# Patient Record
Sex: Male | Born: 1960 | Hispanic: No
Health system: Southern US, Community
[De-identification: ages and names within clinical notes are randomized; demographics above are authoritative.]

---

## 2008-08-18 ENCOUNTER — Emergency Department: Payer: Self-pay | Admitting: Internal Medicine

## 2008-08-30 ENCOUNTER — Emergency Department: Payer: Self-pay | Admitting: Emergency Medicine

## 2010-09-08 ENCOUNTER — Ambulatory Visit: Payer: Self-pay | Admitting: Oncology

## 2010-09-18 ENCOUNTER — Ambulatory Visit: Payer: Self-pay | Admitting: Oncology

## 2010-09-19 LAB — CEA: CEA: 39.2 ng/mL — ABNORMAL HIGH (ref 0.0–4.7)

## 2010-09-20 ENCOUNTER — Telehealth: Payer: Self-pay | Admitting: *Deleted

## 2010-09-20 DIAGNOSIS — K6289 Other specified diseases of anus and rectum: Secondary | ICD-10-CM

## 2010-09-20 NOTE — Telephone Encounter (Signed)
lmom for sister Jonette Eva to call bac. Dr Christella Hartigan would add pt on tomorrow for EUS at [facility] if reached.

## 2010-09-20 NOTE — Telephone Encounter (Signed)
lmom for sister to call back. If available for procedure, I asked her to call 1st thing in am and have pt not eat or drink anything.

## 2010-09-21 ENCOUNTER — Ambulatory Visit (HOSPITAL_COMMUNITY)
Admission: RE | Admit: 2010-09-21 | Discharge: 2010-09-21 | Disposition: A | Discharge status: Home / Self Care | Payer: BC Managed Care – PPO | Source: Ambulatory Visit | Attending: Gastroenterology | Admitting: Gastroenterology

## 2010-09-21 ENCOUNTER — Encounter: Payer: Self-pay | Admitting: *Deleted

## 2010-09-21 ENCOUNTER — Encounter: Payer: BC Managed Care – PPO | Admitting: Gastroenterology

## 2010-09-21 DIAGNOSIS — C20 Malignant neoplasm of rectum: Secondary | ICD-10-CM | POA: Insufficient documentation

## 2010-09-21 NOTE — Telephone Encounter (Signed)
Ok, thanks.

## 2010-09-21 NOTE — Telephone Encounter (Signed)
Dr Christella Hartigan. I spoke with Shawn who showed the CT from 09/19/10 that you saw. Dr Doylene Canning has decided to present the pt's case to CASE CONFERENCE today. Ines Bloomer will let me know of their decision. So no EUS today! Thanks.

## 2010-09-21 NOTE — Telephone Encounter (Signed)
Worked it out and pt will have procedure today.

## 2010-09-29 ENCOUNTER — Ambulatory Visit: Payer: Self-pay | Admitting: Surgery

## 2010-10-08 ENCOUNTER — Ambulatory Visit: Payer: Self-pay | Admitting: Oncology

## 2010-11-08 ENCOUNTER — Ambulatory Visit: Payer: Self-pay | Admitting: Oncology

## 2010-12-09 ENCOUNTER — Ambulatory Visit: Payer: Self-pay | Admitting: Oncology

## 2011-01-03 ENCOUNTER — Ambulatory Visit: Payer: Self-pay | Admitting: Oncology

## 2011-01-08 ENCOUNTER — Ambulatory Visit: Payer: Self-pay | Admitting: Oncology

## 2011-02-08 ENCOUNTER — Ambulatory Visit: Payer: Self-pay | Admitting: Oncology

## 2011-03-10 ENCOUNTER — Ambulatory Visit: Payer: Self-pay | Admitting: Oncology

## 2011-04-10 ENCOUNTER — Ambulatory Visit: Payer: Self-pay | Admitting: Oncology

## 2011-05-03 LAB — COMPREHENSIVE METABOLIC PANEL
Alkaline Phosphatase: 103 U/L (ref 50–136)
Anion Gap: 7 (ref 7–16)
Bilirubin,Total: 0.3 mg/dL (ref 0.2–1.0)
Chloride: 104 mmol/L (ref 98–107)
Co2: 30 mmol/L (ref 21–32)
Creatinine: 0.93 mg/dL (ref 0.60–1.30)
Osmolality: 281 (ref 275–301)
Potassium: 3.5 mmol/L (ref 3.5–5.1)
SGPT (ALT): 15 U/L
Sodium: 141 mmol/L (ref 136–145)

## 2011-05-03 LAB — CBC CANCER CENTER
Basophil #: 0 x10 3/mm (ref 0.0–0.1)
Eosinophil #: 0.4 x10 3/mm (ref 0.0–0.7)
HGB: 13.3 g/dL (ref 13.0–18.0)
Lymphocyte #: 0.9 x10 3/mm — ABNORMAL LOW (ref 1.0–3.6)
MCH: 26.2 pg (ref 26.0–34.0)
MCHC: 32.8 g/dL (ref 32.0–36.0)
MCV: 80 fL (ref 80–100)
Monocyte #: 0.5 x10 3/mm (ref 0.0–0.7)
Monocyte %: 5.9 %
Neutrophil %: 77.2 %
Platelet: 341 x10 3/mm (ref 150–440)
RBC: 5.08 10*6/uL (ref 4.40–5.90)
RDW: 17.7 % — ABNORMAL HIGH (ref 11.5–14.5)
WBC: 7.8 x10 3/mm (ref 3.8–10.6)

## 2011-05-04 LAB — CEA: CEA: 18.5 ng/mL — ABNORMAL HIGH (ref 0.0–4.7)

## 2011-05-11 ENCOUNTER — Ambulatory Visit: Payer: Self-pay | Admitting: Oncology

## 2011-05-17 LAB — CBC CANCER CENTER
Basophil #: 0 x10 3/mm (ref 0.0–0.1)
Eosinophil %: 5.8 %
Lymphocyte #: 1.1 x10 3/mm (ref 1.0–3.6)
MCH: 26.5 pg (ref 26.0–34.0)
MCHC: 33.1 g/dL (ref 32.0–36.0)
MCV: 80 fL (ref 80–100)
Monocyte #: 0.4 x10 3/mm (ref 0.0–0.7)
Platelet: 304 x10 3/mm (ref 150–440)
RBC: 4.94 10*6/uL (ref 4.40–5.90)
RDW: 17 % — ABNORMAL HIGH (ref 11.5–14.5)

## 2011-05-24 LAB — COMPREHENSIVE METABOLIC PANEL WITH GFR
Albumin: 3.4 g/dL
Alkaline Phosphatase: 107 U/L
Anion Gap: 9
BUN: 13 mg/dL
Bilirubin,Total: 0.2 mg/dL
Calcium, Total: 8.8 mg/dL
Chloride: 102 mmol/L
Co2: 31 mmol/L
Creatinine: 0.99 mg/dL
EGFR (African American): >60
EGFR (Non-African Amer.): >60
Glucose: 160 mg/dL — ABNORMAL HIGH
Osmolality: 287
Potassium: 3.2 mmol/L — ABNORMAL LOW
SGOT(AST): 15 U/L
SGPT (ALT): 24 U/L
Sodium: 142 mmol/L
Total Protein: 7.4 g/dL

## 2011-05-24 LAB — CBC CANCER CENTER
Basophil %: 0 %
Eosinophil %: 4.8 %
HCT: 38.9 % — ABNORMAL LOW (ref 40.0–52.0)
HGB: 12.8 g/dL — ABNORMAL LOW (ref 13.0–18.0)
Lymphocyte #: 1.2 x10 3/mm (ref 1.0–3.6)
MCH: 26.5 pg (ref 26.0–34.0)
MCHC: 32.9 g/dL (ref 32.0–36.0)
MCV: 80 fL (ref 80–100)
Neutrophil #: 7.1 x10 3/mm — ABNORMAL HIGH (ref 1.4–6.5)
Platelet: 306 x10 3/mm (ref 150–440)
RBC: 4.84 10*6/uL (ref 4.40–5.90)
RDW: 17.5 % — ABNORMAL HIGH (ref 11.5–14.5)

## 2011-06-07 LAB — CBC CANCER CENTER
Basophil #: 0 x10 3/mm (ref 0.0–0.1)
Eosinophil #: 0.5 x10 3/mm (ref 0.0–0.7)
HCT: 40.6 % (ref 40.0–52.0)
Lymphocyte #: 1.2 x10 3/mm (ref 1.0–3.6)
MCH: 26.2 pg (ref 26.0–34.0)
MCHC: 32.7 g/dL (ref 32.0–36.0)
MCV: 80 fL (ref 80–100)
Monocyte #: 0.7 x10 3/mm (ref 0.0–0.7)
Neutrophil #: 6.6 x10 3/mm — ABNORMAL HIGH (ref 1.4–6.5)
Platelet: 282 x10 3/mm (ref 150–440)
RDW: 17.2 % — ABNORMAL HIGH (ref 11.5–14.5)
WBC: 9.1 x10 3/mm (ref 3.8–10.6)

## 2011-06-07 LAB — MAGNESIUM: Magnesium: 2 mg/dL

## 2011-06-07 LAB — BASIC METABOLIC PANEL
BUN: 10 mg/dL (ref 7–18)
Chloride: 104 mmol/L (ref 98–107)
Co2: 29 mmol/L (ref 21–32)
Creatinine: 0.97 mg/dL (ref 0.60–1.30)
EGFR (African American): >60
EGFR (Non-African Amer.): >60
Potassium: 3.7 mmol/L (ref 3.5–5.1)
Sodium: 143 mmol/L (ref 136–145)

## 2011-06-08 ENCOUNTER — Ambulatory Visit: Payer: Self-pay | Admitting: Oncology

## 2011-07-09 ENCOUNTER — Ambulatory Visit: Payer: Self-pay | Admitting: Oncology

## 2011-09-10 ENCOUNTER — Ambulatory Visit: Payer: Self-pay | Admitting: Oncology

## 2011-09-10 LAB — COMPREHENSIVE METABOLIC PANEL
Alkaline Phosphatase: 85 U/L (ref 50–136)
Anion Gap: 8 (ref 7–16)
Chloride: 103 mmol/L (ref 98–107)
Creatinine: 0.77 mg/dL (ref 0.60–1.30)
EGFR (African American): >60
EGFR (Non-African Amer.): >60
Sodium: 140 mmol/L (ref 136–145)

## 2011-09-10 LAB — CBC CANCER CENTER
Basophil #: 0.1 x10 3/mm (ref 0.0–0.1)
Basophil %: 1.1 %
HCT: 39.8 % — ABNORMAL LOW (ref 40.0–52.0)
HGB: 12.7 g/dL — ABNORMAL LOW (ref 13.0–18.0)
Lymphocyte #: 1 x10 3/mm (ref 1.0–3.6)
Lymphocyte %: 12.2 %
MCV: 82 fL (ref 80–100)
Monocyte #: 0.7 x10 3/mm (ref 0.2–1.0)
Monocyte %: 8.7 %
Neutrophil #: 5.8 x10 3/mm (ref 1.4–6.5)
Neutrophil %: 70.8 %
Platelet: 355 x10 3/mm (ref 150–440)
RBC: 4.83 10*6/uL (ref 4.40–5.90)
WBC: 8.3 x10 3/mm (ref 3.8–10.6)

## 2011-10-08 ENCOUNTER — Ambulatory Visit: Payer: Self-pay | Admitting: Oncology

## 2011-10-18 LAB — CBC CANCER CENTER
Basophil #: 0.1 x10 3/mm (ref 0.0–0.1)
HCT: 38.1 % — ABNORMAL LOW (ref 40.0–52.0)
Lymphocyte #: 1.2 x10 3/mm (ref 1.0–3.6)
MCHC: 32.1 g/dL (ref 32.0–36.0)
Monocyte #: 0.8 x10 3/mm (ref 0.2–1.0)
Monocyte %: 8.1 %
Platelet: 388 x10 3/mm (ref 150–440)
RDW: 14.7 % — ABNORMAL HIGH (ref 11.5–14.5)
WBC: 10.3 x10 3/mm (ref 3.8–10.6)

## 2011-10-18 LAB — COMPREHENSIVE METABOLIC PANEL
Albumin: 3.2 g/dL — ABNORMAL LOW (ref 3.4–5.0)
Anion Gap: 9 (ref 7–16)
Calcium, Total: 9.1 mg/dL (ref 8.5–10.1)
Chloride: 102 mmol/L (ref 98–107)
Co2: 28 mmol/L (ref 21–32)
Glucose: 108 mg/dL — ABNORMAL HIGH (ref 65–99)
Osmolality: 277 (ref 275–301)
Potassium: 3.9 mmol/L (ref 3.5–5.1)
SGPT (ALT): 16 U/L
Sodium: 139 mmol/L (ref 136–145)

## 2011-11-08 ENCOUNTER — Ambulatory Visit: Payer: Self-pay | Admitting: Oncology

## 2011-11-08 LAB — CBC CANCER CENTER
Basophil %: 0.9 %
Eosinophil #: 0.6 x10 3/mm (ref 0.0–0.7)
Eosinophil %: 7.8 %
HCT: 40.2 % (ref 40.0–52.0)
HGB: 13.3 g/dL (ref 13.0–18.0)
Lymphocyte #: 1.2 x10 3/mm (ref 1.0–3.6)
MCH: 26.3 pg (ref 26.0–34.0)
MCHC: 33 g/dL (ref 32.0–36.0)
Monocyte #: 1 x10 3/mm (ref 0.2–1.0)
Monocyte %: 12.2 %
Neutrophil #: 5 x10 3/mm (ref 1.4–6.5)
RDW: 15.1 % — ABNORMAL HIGH (ref 11.5–14.5)
WBC: 7.9 x10 3/mm (ref 3.8–10.6)

## 2011-11-08 LAB — COMPREHENSIVE METABOLIC PANEL
Albumin: 3.3 g/dL — ABNORMAL LOW (ref 3.4–5.0)
Alkaline Phosphatase: 108 U/L (ref 50–136)
Anion Gap: 9 (ref 7–16)
BUN: 9 mg/dL (ref 7–18)
Chloride: 102 mmol/L (ref 98–107)
Creatinine: 0.79 mg/dL (ref 0.60–1.30)
EGFR (Non-African Amer.): >60
Glucose: 94 mg/dL (ref 65–99)
Osmolality: 276 (ref 275–301)
Potassium: 3.6 mmol/L (ref 3.5–5.1)
SGOT(AST): 18 U/L (ref 15–37)
SGPT (ALT): 21 U/L
Total Protein: 8 g/dL (ref 6.4–8.2)

## 2011-12-09 ENCOUNTER — Ambulatory Visit: Payer: Self-pay | Admitting: Oncology

## 2012-01-03 LAB — COMPREHENSIVE METABOLIC PANEL
Alkaline Phosphatase: 95 U/L (ref 50–136)
Anion Gap: 8 (ref 7–16)
BUN: 5 mg/dL — ABNORMAL LOW (ref 7–18)
Bilirubin,Total: 0.3 mg/dL (ref 0.2–1.0)
Calcium, Total: 8.9 mg/dL (ref 8.5–10.1)
Chloride: 105 mmol/L (ref 98–107)
Co2: 29 mmol/L (ref 21–32)
EGFR (African American): >60
EGFR (Non-African Amer.): >60
Osmolality: 280 (ref 275–301)
Total Protein: 7.7 g/dL (ref 6.4–8.2)

## 2012-01-03 LAB — CBC CANCER CENTER
Basophil #: 0.1 x10 3/mm (ref 0.0–0.1)
Lymphocyte #: 1.2 x10 3/mm (ref 1.0–3.6)
MCH: 25.5 pg — ABNORMAL LOW (ref 26.0–34.0)
MCHC: 31.8 g/dL — ABNORMAL LOW (ref 32.0–36.0)
MCV: 80 fL (ref 80–100)
Monocyte #: 0.9 x10 3/mm (ref 0.2–1.0)
Monocyte %: 8.7 %
Neutrophil #: 7.6 x10 3/mm — ABNORMAL HIGH (ref 1.4–6.5)
Neutrophil %: 72.1 %
Platelet: 369 x10 3/mm (ref 150–440)
RDW: 17.3 % — ABNORMAL HIGH (ref 11.5–14.5)
WBC: 10.6 x10 3/mm (ref 3.8–10.6)

## 2012-01-08 ENCOUNTER — Ambulatory Visit: Payer: Self-pay | Admitting: Oncology

## 2012-02-08 ENCOUNTER — Ambulatory Visit: Payer: Self-pay | Admitting: Oncology

## 2012-02-21 LAB — CBC CANCER CENTER
Basophil #: 0.1 x10 3/mm (ref 0.0–0.1)
Eosinophil #: 0.5 x10 3/mm (ref 0.0–0.7)
HGB: 11.7 g/dL — ABNORMAL LOW (ref 13.0–18.0)
Lymphocyte %: 10.3 %
MCH: 24.8 pg — ABNORMAL LOW (ref 26.0–34.0)
MCHC: 31.5 g/dL — ABNORMAL LOW (ref 32.0–36.0)
Neutrophil #: 9.1 x10 3/mm — ABNORMAL HIGH (ref 1.4–6.5)
Neutrophil %: 74.7 %
RDW: 15.7 % — ABNORMAL HIGH (ref 11.5–14.5)
WBC: 12.2 x10 3/mm — ABNORMAL HIGH (ref 3.8–10.6)

## 2012-02-21 LAB — COMPREHENSIVE METABOLIC PANEL
Albumin: 3.3 g/dL — ABNORMAL LOW (ref 3.4–5.0)
Alkaline Phosphatase: 102 U/L (ref 50–136)
Anion Gap: 12 (ref 7–16)
BUN: 16 mg/dL (ref 7–18)
Bilirubin,Total: 0.3 mg/dL (ref 0.2–1.0)
Calcium, Total: 9.2 mg/dL (ref 8.5–10.1)
Chloride: 102 mmol/L (ref 98–107)
Co2: 27 mmol/L (ref 21–32)
EGFR (African American): 46 — ABNORMAL LOW
Glucose: 116 mg/dL — ABNORMAL HIGH (ref 65–99)
SGOT(AST): 18 U/L (ref 15–37)
Total Protein: 8.1 g/dL (ref 6.4–8.2)

## 2012-02-24 LAB — CEA: CEA: 44.9 ng/mL — ABNORMAL HIGH (ref 0.0–4.7)

## 2012-02-28 LAB — COMPREHENSIVE METABOLIC PANEL
Albumin: 3.4 g/dL (ref 3.4–5.0)
Alkaline Phosphatase: 89 U/L (ref 50–136)
BUN: 21 mg/dL — ABNORMAL HIGH (ref 7–18)
Bilirubin,Total: 0.3 mg/dL (ref 0.2–1.0)
Calcium, Total: 9.2 mg/dL (ref 8.5–10.1)
Chloride: 104 mmol/L (ref 98–107)
Co2: 22 mmol/L (ref 21–32)
Creatinine: 1.97 mg/dL — ABNORMAL HIGH (ref 0.60–1.30)
SGPT (ALT): 16 U/L (ref 12–78)
Total Protein: 8.2 g/dL (ref 6.4–8.2)

## 2012-02-28 LAB — CBC CANCER CENTER
Basophil %: 1 %
Eosinophil %: 4.2 %
HGB: 11.2 g/dL — ABNORMAL LOW (ref 13.0–18.0)
Lymphocyte %: 10.3 %
MCV: 79 fL — ABNORMAL LOW (ref 80–100)
Monocyte %: 7.9 %
Neutrophil %: 76.6 %
Platelet: 447 x10 3/mm — ABNORMAL HIGH (ref 150–440)

## 2012-03-05 ENCOUNTER — Ambulatory Visit: Payer: Self-pay | Admitting: General Surgery

## 2012-03-09 ENCOUNTER — Ambulatory Visit: Payer: Self-pay | Admitting: Oncology

## 2012-03-19 LAB — PROTIME-INR
INR: 1
Prothrombin Time: 13.8 secs (ref 11.5–14.7)

## 2012-03-19 LAB — COMPREHENSIVE METABOLIC PANEL
Alkaline Phosphatase: 85 U/L (ref 50–136)
Anion Gap: 15 (ref 7–16)
Calcium, Total: 9.6 mg/dL (ref 8.5–10.1)
Co2: 22 mmol/L (ref 21–32)
EGFR (African American): 40 — ABNORMAL LOW
EGFR (Non-African Amer.): 35 — ABNORMAL LOW
Glucose: 121 mg/dL — ABNORMAL HIGH (ref 65–99)
Osmolality: 282 (ref 275–301)
Potassium: 3.2 mmol/L — ABNORMAL LOW (ref 3.5–5.1)
SGOT(AST): 15 U/L (ref 15–37)
Sodium: 139 mmol/L (ref 136–145)

## 2012-03-19 LAB — CBC CANCER CENTER
Basophil %: 0.7 %
Eosinophil #: 0.3 x10 3/mm (ref 0.0–0.7)
Eosinophil %: 1.8 %
HCT: 32.6 % — ABNORMAL LOW (ref 40.0–52.0)
Lymphocyte %: 10 %
MCH: 24.5 pg — ABNORMAL LOW (ref 26.0–34.0)
MCHC: 32.5 g/dL (ref 32.0–36.0)
Monocyte #: 1.3 x10 3/mm — ABNORMAL HIGH (ref 0.2–1.0)
Monocyte %: 8.5 %
Neutrophil #: 12.6 x10 3/mm — ABNORMAL HIGH (ref 1.4–6.5)
Neutrophil %: 79 %
Platelet: 482 x10 3/mm — ABNORMAL HIGH (ref 150–440)
RDW: 15.7 % — ABNORMAL HIGH (ref 11.5–14.5)
WBC: 15.9 x10 3/mm — ABNORMAL HIGH (ref 3.8–10.6)

## 2012-03-24 ENCOUNTER — Ambulatory Visit: Payer: Self-pay | Admitting: Oncology

## 2012-03-24 LAB — APTT: Activated PTT: 34.9 secs (ref 23.6–35.9)

## 2012-04-01 LAB — COMPREHENSIVE METABOLIC PANEL
Albumin: 3.2 g/dL — ABNORMAL LOW (ref 3.4–5.0)
Anion Gap: 11 (ref 7–16)
BUN: 20 mg/dL — ABNORMAL HIGH (ref 7–18)
Chloride: 97 mmol/L — ABNORMAL LOW (ref 98–107)
Co2: 29 mmol/L (ref 21–32)
EGFR (African American): 53 — ABNORMAL LOW
EGFR (Non-African Amer.): 46 — ABNORMAL LOW
Glucose: 185 mg/dL — ABNORMAL HIGH (ref 65–99)
Osmolality: 281 (ref 275–301)
Potassium: 3.7 mmol/L (ref 3.5–5.1)
SGOT(AST): 16 U/L (ref 15–37)
Sodium: 137 mmol/L (ref 136–145)

## 2012-04-01 LAB — CBC CANCER CENTER
Basophil #: 0.1 x10 3/mm (ref 0.0–0.1)
Basophil %: 0.8 %
Eosinophil #: 0.4 x10 3/mm (ref 0.0–0.7)
Eosinophil %: 2.3 %
HCT: 33 % — ABNORMAL LOW (ref 40.0–52.0)
HGB: 10.5 g/dL — ABNORMAL LOW (ref 13.0–18.0)
Lymphocyte %: 7.1 %
MCH: 23.8 pg — ABNORMAL LOW (ref 26.0–34.0)
MCHC: 31.7 g/dL — ABNORMAL LOW (ref 32.0–36.0)
Monocyte #: 1 x10 3/mm (ref 0.2–1.0)
Neutrophil #: 15.5 x10 3/mm — ABNORMAL HIGH (ref 1.4–6.5)
Platelet: 575 x10 3/mm — ABNORMAL HIGH (ref 150–440)
RDW: 15.7 % — ABNORMAL HIGH (ref 11.5–14.5)
WBC: 18.3 x10 3/mm — ABNORMAL HIGH (ref 3.8–10.6)

## 2012-04-09 ENCOUNTER — Ambulatory Visit: Payer: Self-pay | Admitting: Oncology

## 2012-04-23 ENCOUNTER — Ambulatory Visit: Payer: Self-pay | Admitting: Oncology

## 2012-04-23 LAB — PROTIME-INR
INR: 1
Prothrombin Time: 13.5 secs (ref 11.5–14.7)

## 2012-04-23 LAB — PLATELET COUNT: Platelet: 657 10*3/uL — ABNORMAL HIGH (ref 150–440)

## 2012-04-23 LAB — APTT: Activated PTT: 41 secs — ABNORMAL HIGH (ref 23.6–35.9)

## 2012-04-29 LAB — COMPREHENSIVE METABOLIC PANEL
Albumin: 2.7 g/dL — ABNORMAL LOW (ref 3.4–5.0)
Alkaline Phosphatase: 92 U/L (ref 50–136)
Anion Gap: 9 (ref 7–16)
BUN: 9 mg/dL (ref 7–18)
Bilirubin,Total: 0.3 mg/dL (ref 0.2–1.0)
Co2: 30 mmol/L (ref 21–32)
Creatinine: 1.18 mg/dL (ref 0.60–1.30)
EGFR (African American): >60
EGFR (Non-African Amer.): >60
Potassium: 3.4 mmol/L — ABNORMAL LOW (ref 3.5–5.1)
SGOT(AST): 18 U/L (ref 15–37)
SGPT (ALT): 18 U/L (ref 12–78)
Sodium: 139 mmol/L (ref 136–145)
Total Protein: 7.7 g/dL (ref 6.4–8.2)

## 2012-04-29 LAB — CBC CANCER CENTER
Eosinophil %: 3.5 %
HCT: 27.3 % — ABNORMAL LOW (ref 40.0–52.0)
HGB: 8.6 g/dL — ABNORMAL LOW (ref 13.0–18.0)
MCH: 22.9 pg — ABNORMAL LOW (ref 26.0–34.0)
Monocyte #: 1.5 x10 3/mm — ABNORMAL HIGH (ref 0.2–1.0)
Platelet: 694 x10 3/mm — ABNORMAL HIGH (ref 150–440)
RBC: 3.75 10*6/uL — ABNORMAL LOW (ref 4.40–5.90)
RDW: 15.6 % — ABNORMAL HIGH (ref 11.5–14.5)
WBC: 20.1 x10 3/mm — ABNORMAL HIGH (ref 3.8–10.6)

## 2012-05-10 ENCOUNTER — Ambulatory Visit: Payer: Self-pay | Admitting: Oncology

## 2012-05-13 LAB — CBC CANCER CENTER
Basophil #: 0.2 x10 3/mm — ABNORMAL HIGH (ref 0.0–0.1)
Basophil %: 1.5 %
Eosinophil %: 8.6 %
HCT: 28.4 % — ABNORMAL LOW (ref 40.0–52.0)
HGB: 9 g/dL — ABNORMAL LOW (ref 13.0–18.0)
Lymphocyte #: 1.3 x10 3/mm (ref 1.0–3.6)
Lymphocyte %: 13.1 %
MCH: 22.9 pg — ABNORMAL LOW (ref 26.0–34.0)
MCV: 72 fL — ABNORMAL LOW (ref 80–100)
Monocyte #: 1 x10 3/mm (ref 0.2–1.0)
Neutrophil #: 6.6 x10 3/mm — ABNORMAL HIGH (ref 1.4–6.5)
Neutrophil %: 67.1 %
RBC: 3.94 10*6/uL — ABNORMAL LOW (ref 4.40–5.90)
RDW: 16 % — ABNORMAL HIGH (ref 11.5–14.5)
WBC: 9.8 x10 3/mm (ref 3.8–10.6)

## 2012-05-13 LAB — COMPREHENSIVE METABOLIC PANEL
Albumin: 2.6 g/dL — ABNORMAL LOW (ref 3.4–5.0)
Anion Gap: 12 (ref 7–16)
Bilirubin,Total: 0.2 mg/dL (ref 0.2–1.0)
Calcium, Total: 9 mg/dL (ref 8.5–10.1)
Creatinine: 1.39 mg/dL — ABNORMAL HIGH (ref 0.60–1.30)
EGFR (Non-African Amer.): 59 — ABNORMAL LOW
Glucose: 131 mg/dL — ABNORMAL HIGH (ref 65–99)
Osmolality: 277 (ref 275–301)
Potassium: 3.5 mmol/L (ref 3.5–5.1)
SGOT(AST): 13 U/L — ABNORMAL LOW (ref 15–37)
Sodium: 138 mmol/L (ref 136–145)
Total Protein: 7.8 g/dL (ref 6.4–8.2)

## 2012-05-28 LAB — CBC CANCER CENTER
Basophil %: 1.8 %
Eosinophil %: 5.4 %
Lymphocyte #: 1.3 x10 3/mm (ref 1.0–3.6)
Lymphocyte %: 14 %
MCH: 22.9 pg — ABNORMAL LOW (ref 26.0–34.0)
MCV: 72 fL — ABNORMAL LOW (ref 80–100)
Monocyte #: 1.2 x10 3/mm — ABNORMAL HIGH (ref 0.2–1.0)

## 2012-05-28 LAB — COMPREHENSIVE METABOLIC PANEL
Albumin: 2.7 g/dL — ABNORMAL LOW (ref 3.4–5.0)
Alkaline Phosphatase: 109 U/L (ref 50–136)
BUN: 6 mg/dL — ABNORMAL LOW (ref 7–18)
Calcium, Total: 8.7 mg/dL (ref 8.5–10.1)
Chloride: 102 mmol/L (ref 98–107)
EGFR (African American): >60
Osmolality: 276 (ref 275–301)
Potassium: 3.3 mmol/L — ABNORMAL LOW (ref 3.5–5.1)

## 2012-06-07 ENCOUNTER — Ambulatory Visit: Payer: Self-pay | Admitting: Oncology

## 2012-07-08 ENCOUNTER — Ambulatory Visit: Payer: Self-pay | Admitting: Oncology

## 2012-07-08 LAB — COMPREHENSIVE METABOLIC PANEL
Albumin: 2.4 g/dL — ABNORMAL LOW (ref 3.4–5.0)
Alkaline Phosphatase: 131 U/L (ref 50–136)
Anion Gap: 14 (ref 7–16)
Bilirubin,Total: 0.4 mg/dL (ref 0.2–1.0)
Co2: 24 mmol/L (ref 21–32)
Creatinine: 1.66 mg/dL — ABNORMAL HIGH (ref 0.60–1.30)
EGFR (Non-African Amer.): 47 — ABNORMAL LOW
Osmolality: 273 (ref 275–301)
Potassium: 3.5 mmol/L (ref 3.5–5.1)
Total Protein: 8.2 g/dL (ref 6.4–8.2)

## 2012-07-08 LAB — CBC CANCER CENTER
Basophil #: 0.1 x10 3/mm (ref 0.0–0.1)
Basophil %: 0.3 %
Eosinophil #: 0.1 x10 3/mm (ref 0.0–0.7)
Eosinophil %: 0.2 %
Lymphocyte %: 2.8 %
MCH: 21.8 pg — ABNORMAL LOW (ref 26.0–34.0)
MCV: 70 fL — ABNORMAL LOW (ref 80–100)
Monocyte #: 1.7 x10 3/mm — ABNORMAL HIGH (ref 0.2–1.0)
Monocyte %: 5.7 %
Neutrophil #: 27.7 x10 3/mm — ABNORMAL HIGH (ref 1.4–6.5)
Neutrophil %: 91 %
Platelet: 553 x10 3/mm — ABNORMAL HIGH (ref 150–440)
RBC: 4.36 10*6/uL — ABNORMAL LOW (ref 4.40–5.90)

## 2012-07-09 LAB — CEA: CEA: 58.2 ng/mL — ABNORMAL HIGH (ref 0.0–4.7)

## 2012-08-05 LAB — COMPREHENSIVE METABOLIC PANEL
Albumin: 2.2 g/dL — ABNORMAL LOW (ref 3.4–5.0)
Bilirubin,Total: 0.3 mg/dL (ref 0.2–1.0)
Calcium, Total: 9.2 mg/dL (ref 8.5–10.1)
Chloride: 97 mmol/L — ABNORMAL LOW (ref 98–107)
Co2: 14 mmol/L — ABNORMAL LOW (ref 21–32)
Creatinine: 4 mg/dL — ABNORMAL HIGH (ref 0.60–1.30)
EGFR (African American): 19 — ABNORMAL LOW
EGFR (Non-African Amer.): 16 — ABNORMAL LOW
Glucose: 136 mg/dL — ABNORMAL HIGH (ref 65–99)
Potassium: 2.7 mmol/L — ABNORMAL LOW (ref 3.5–5.1)
SGOT(AST): 15 U/L (ref 15–37)
SGPT (ALT): 18 U/L (ref 12–78)
Sodium: 130 mmol/L — ABNORMAL LOW (ref 136–145)
Total Protein: 8.5 g/dL — ABNORMAL HIGH (ref 6.4–8.2)

## 2012-08-05 LAB — CBC CANCER CENTER
Basophil %: 0.6 %
Eosinophil #: 0.2 x10 3/mm (ref 0.0–0.7)
Eosinophil %: 0.7 %
Lymphocyte %: 4.6 %
MCH: 20.8 pg — ABNORMAL LOW (ref 26.0–34.0)
MCHC: 30 g/dL — ABNORMAL LOW (ref 32.0–36.0)
MCV: 69 fL — ABNORMAL LOW (ref 80–100)
Monocyte #: 1.4 x10 3/mm — ABNORMAL HIGH (ref 0.2–1.0)
Monocyte %: 5.8 %
Neutrophil #: 21.6 x10 3/mm — ABNORMAL HIGH (ref 1.4–6.5)

## 2012-08-07 ENCOUNTER — Ambulatory Visit: Payer: Self-pay | Admitting: Oncology

## 2012-08-28 LAB — CBC WITH DIFFERENTIAL/PLATELET
Basophil %: 0.3 %
Eosinophil #: 0 10*3/uL (ref 0.0–0.7)
HCT: 18.2 % — ABNORMAL LOW (ref 40.0–52.0)
HGB: 5.5 g/dL — ABNORMAL LOW (ref 13.0–18.0)
Lymphocyte #: 0.3 10*3/uL — ABNORMAL LOW (ref 1.0–3.6)
Lymphocyte %: 1.1 %
MCHC: 30.1 g/dL — ABNORMAL LOW (ref 32.0–36.0)
MCV: 66 fL — ABNORMAL LOW (ref 80–100)
Monocyte %: 0.6 %
Neutrophil #: 22.9 10*3/uL — ABNORMAL HIGH (ref 1.4–6.5)
Neutrophil %: 97.9 %
RBC: 2.77 10*6/uL — ABNORMAL LOW (ref 4.40–5.90)
RDW: 19.9 % — ABNORMAL HIGH (ref 11.5–14.5)
WBC: 23.4 10*3/uL — ABNORMAL HIGH (ref 3.8–10.6)

## 2012-08-28 LAB — URINALYSIS, COMPLETE
Bilirubin,UR: NEGATIVE
Glucose,UR: NEGATIVE mg/dL (ref 0–75)
Nitrite: NEGATIVE
Ph: 7 (ref 4.5–8.0)
Protein: 30
RBC,UR: 14 /HPF (ref 0–5)
Squamous Epithelial: NONE SEEN

## 2012-08-28 LAB — COMPREHENSIVE METABOLIC PANEL
Alkaline Phosphatase: 120 U/L (ref 50–136)
Anion Gap: 10 (ref 7–16)
Bilirubin,Total: 0.3 mg/dL (ref 0.2–1.0)
Calcium, Total: 8 mg/dL — ABNORMAL LOW (ref 8.5–10.1)
Co2: 17 mmol/L — ABNORMAL LOW (ref 21–32)
EGFR (African American): 24 — ABNORMAL LOW
EGFR (Non-African Amer.): 20 — ABNORMAL LOW
Osmolality: 283 (ref 275–301)
Potassium: 2.5 mmol/L — CL (ref 3.5–5.1)
SGOT(AST): 13 U/L — ABNORMAL LOW (ref 15–37)
SGPT (ALT): 9 U/L — ABNORMAL LOW (ref 12–78)
Sodium: 134 mmol/L — ABNORMAL LOW (ref 136–145)
Total Protein: 7 g/dL (ref 6.4–8.2)

## 2012-08-28 LAB — PROTIME-INR: INR: 1

## 2012-08-28 LAB — APTT: Activated PTT: 45.6 secs — ABNORMAL HIGH (ref 23.6–35.9)

## 2012-08-29 ENCOUNTER — Inpatient Hospital Stay: Payer: Self-pay | Admitting: Oncology

## 2012-08-29 LAB — COMPREHENSIVE METABOLIC PANEL
Albumin: 1.5 g/dL — ABNORMAL LOW (ref 3.4–5.0)
Alkaline Phosphatase: 119 U/L (ref 50–136)
Anion Gap: 12 (ref 7–16)
BUN: 48 mg/dL — ABNORMAL HIGH (ref 7–18)
Bilirubin,Total: 0.5 mg/dL (ref 0.2–1.0)
Creatinine: 3.18 mg/dL — ABNORMAL HIGH (ref 0.60–1.30)
Glucose: 128 mg/dL — ABNORMAL HIGH (ref 65–99)
Osmolality: 294 (ref 275–301)
Potassium: 3.3 mmol/L — ABNORMAL LOW (ref 3.5–5.1)
SGOT(AST): 13 U/L — ABNORMAL LOW (ref 15–37)
SGPT (ALT): 9 U/L — ABNORMAL LOW (ref 12–78)
Sodium: 140 mmol/L (ref 136–145)
Total Protein: 6.3 g/dL — ABNORMAL LOW (ref 6.4–8.2)

## 2012-08-29 LAB — CBC WITH DIFFERENTIAL/PLATELET
Basophil #: 0.1 10*3/uL (ref 0.0–0.1)
Eosinophil %: 0 %
MCH: 22.2 pg — ABNORMAL LOW (ref 26.0–34.0)
MCV: 72 fL — ABNORMAL LOW (ref 80–100)
Monocyte #: 1 x10 3/mm (ref 0.2–1.0)
Monocyte %: 3 %
RDW: 22.1 % — ABNORMAL HIGH (ref 11.5–14.5)

## 2012-08-30 LAB — CBC WITH DIFFERENTIAL/PLATELET
Basophil #: 0.1 10*3/uL (ref 0.0–0.1)
Basophil %: 0.6 %
Eosinophil %: 2.2 %
HCT: 26.7 % — ABNORMAL LOW (ref 40.0–52.0)
HGB: 8.5 g/dL — ABNORMAL LOW (ref 13.0–18.0)
MCV: 71 fL — ABNORMAL LOW (ref 80–100)
Monocyte %: 5.8 %
Neutrophil #: 19.3 10*3/uL — ABNORMAL HIGH (ref 1.4–6.5)
Platelet: 539 10*3/uL — ABNORMAL HIGH (ref 150–440)
WBC: 22.6 10*3/uL — ABNORMAL HIGH (ref 3.8–10.6)

## 2012-08-30 LAB — COMPREHENSIVE METABOLIC PANEL
Albumin: 1.5 g/dL — ABNORMAL LOW (ref 3.4–5.0)
Alkaline Phosphatase: 112 U/L (ref 50–136)
BUN: 45 mg/dL — ABNORMAL HIGH (ref 7–18)
Bilirubin,Total: 0.2 mg/dL (ref 0.2–1.0)
Calcium, Total: 8 mg/dL — ABNORMAL LOW (ref 8.5–10.1)
Chloride: 116 mmol/L — ABNORMAL HIGH (ref 98–107)
Co2: 14 mmol/L — ABNORMAL LOW (ref 21–32)
EGFR (African American): 27 — ABNORMAL LOW
Glucose: 130 mg/dL — ABNORMAL HIGH (ref 65–99)
Osmolality: 295 (ref 275–301)
Potassium: 3.2 mmol/L — ABNORMAL LOW (ref 3.5–5.1)
SGOT(AST): 14 U/L — ABNORMAL LOW (ref 15–37)
SGPT (ALT): 9 U/L — ABNORMAL LOW (ref 12–78)
Sodium: 141 mmol/L (ref 136–145)
Total Protein: 6.4 g/dL (ref 6.4–8.2)

## 2012-08-31 LAB — BASIC METABOLIC PANEL
BUN: 33 mg/dL — ABNORMAL HIGH (ref 7–18)
Calcium, Total: 7.8 mg/dL — ABNORMAL LOW (ref 8.5–10.1)
Chloride: 116 mmol/L — ABNORMAL HIGH (ref 98–107)
Creatinine: 2.52 mg/dL — ABNORMAL HIGH (ref 0.60–1.30)
EGFR (African American): 33 — ABNORMAL LOW
EGFR (Non-African Amer.): 29 — ABNORMAL LOW
Glucose: 92 mg/dL (ref 65–99)

## 2012-09-01 LAB — URINE CULTURE

## 2012-09-01 LAB — CBC WITH DIFFERENTIAL/PLATELET
Basophil #: 0.1 10*3/uL (ref 0.0–0.1)
Basophil %: 0.7 %
HCT: 25.9 % — ABNORMAL LOW (ref 40.0–52.0)
Lymphocyte #: 1 10*3/uL (ref 1.0–3.6)
MCH: 23.9 pg — ABNORMAL LOW (ref 26.0–34.0)
MCHC: 32.8 g/dL (ref 32.0–36.0)
MCV: 73 fL — ABNORMAL LOW (ref 80–100)
Neutrophil #: 9.7 10*3/uL — ABNORMAL HIGH (ref 1.4–6.5)
Neutrophil %: 79.4 %
Platelet: 527 10*3/uL — ABNORMAL HIGH (ref 150–440)
RBC: 3.55 10*6/uL — ABNORMAL LOW (ref 4.40–5.90)
WBC: 12.2 10*3/uL — ABNORMAL HIGH (ref 3.8–10.6)

## 2012-09-02 LAB — CBC WITH DIFFERENTIAL/PLATELET
Basophil #: 0.1 10*3/uL (ref 0.0–0.1)
Basophil %: 1 %
Eosinophil #: 0.4 10*3/uL (ref 0.0–0.7)
HGB: 8.5 g/dL — ABNORMAL LOW (ref 13.0–18.0)
Lymphocyte %: 8.8 %
MCV: 73 fL — ABNORMAL LOW (ref 80–100)
Monocyte #: 0.5 x10 3/mm (ref 0.2–1.0)
Platelet: 520 10*3/uL — ABNORMAL HIGH (ref 150–440)
RBC: 3.45 10*6/uL — ABNORMAL LOW (ref 4.40–5.90)
RDW: 23.1 % — ABNORMAL HIGH (ref 11.5–14.5)
WBC: 6.2 10*3/uL (ref 3.8–10.6)

## 2012-09-02 LAB — COMPREHENSIVE METABOLIC PANEL
Anion Gap: 11 (ref 7–16)
BUN: 20 mg/dL — ABNORMAL HIGH (ref 7–18)
Bilirubin,Total: 0.2 mg/dL (ref 0.2–1.0)
Calcium, Total: 7.9 mg/dL — ABNORMAL LOW (ref 8.5–10.1)
Chloride: 115 mmol/L — ABNORMAL HIGH (ref 98–107)
EGFR (African American): 39 — ABNORMAL LOW
Osmolality: 291 (ref 275–301)
SGOT(AST): 18 U/L (ref 15–37)
SGPT (ALT): 10 U/L — ABNORMAL LOW (ref 12–78)

## 2012-09-02 LAB — MISC AER/ANAEROBIC CULT.

## 2012-09-03 LAB — CULTURE, BLOOD (SINGLE)

## 2012-09-07 ENCOUNTER — Ambulatory Visit: Payer: Self-pay | Admitting: Oncology

## 2012-10-07 ENCOUNTER — Ambulatory Visit: Payer: Self-pay | Admitting: Oncology

## 2012-12-02 ENCOUNTER — Emergency Department: Payer: Self-pay | Admitting: Emergency Medicine

## 2012-12-02 LAB — CBC
HCT: 23.9 % — ABNORMAL LOW (ref 40.0–52.0)
HGB: 7.4 g/dL — ABNORMAL LOW (ref 13.0–18.0)
MCH: 22 pg — ABNORMAL LOW (ref 26.0–34.0)
Platelet: 16 10*3/uL — CL (ref 150–440)
RDW: 21.3 % — ABNORMAL HIGH (ref 11.5–14.5)
WBC: 61.3 10*3/uL — ABNORMAL HIGH (ref 3.8–10.6)

## 2012-12-02 LAB — COMPREHENSIVE METABOLIC PANEL
Alkaline Phosphatase: 995 U/L — ABNORMAL HIGH (ref 50–136)
Anion Gap: 20 — ABNORMAL HIGH (ref 7–16)
BUN: 162 mg/dL — ABNORMAL HIGH (ref 7–18)
Calcium, Total: 8 mg/dL — ABNORMAL LOW (ref 8.5–10.1)
Co2: 11 mmol/L — ABNORMAL LOW (ref 21–32)
EGFR (African American): 9 — ABNORMAL LOW
EGFR (Non-African Amer.): 8 — ABNORMAL LOW
SGOT(AST): 38 U/L — ABNORMAL HIGH (ref 15–37)
Sodium: 126 mmol/L — ABNORMAL LOW (ref 136–145)
Total Protein: 5.8 g/dL — ABNORMAL LOW (ref 6.4–8.2)

## 2012-12-08 DEATH — deceased

## 2014-06-08 IMAGING — CT NM PET TUM IMG RESTAG (PS) SKULL BASE T - THIGH
1 of 5 series · 1 of 25 positions shown · non-contrast
Comparison: none

REASON FOR EXAM: elevated creatinine   Restaging rectal CA
COMMENTS:

PROCEDURE:     PET - PET/CT COLORECTAL CA RESTG  - March 05, 2012 [DATE]
RESULT:

[Series 102: pet wb · axial · 5.0mm · 4.07mm/px · 1 of 290 slices shown]
[im 145/290]
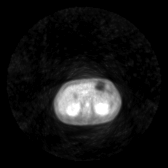

[1 of 25 positions shown; findings below may reference images not displayed]

FINDINGS: The patient has a fasting blood glucose level measuring 118 mg/dl.
The patient received an injection of 12.62 mCi of F18-FDG in the left
antecubital region at [DATE] with imaging obtained from the base of the
brain into the thighs between the hours of [DATE] and [DATE]. Low dose
noncontrast CT of the same regions is performed for the purposes of
attenuation correction and fusion.

There is a similar previous exam for comparison from 03 January, 2011.

There is progressive left hydronephrosis and hydroureter. The urinary
bladder wall is thickened diffusely. Right hydronephrosis is demonstrated.
Abnormal localization is present within the right maxilla region in the area
of the alveolar ridge showing an SUV of 5.94 with a mean of 3.55. There is a
right upper lobe area of abnormal localization showing a maximum SUV of
with a mean of 5.5. There is a mass at the level of the aortic arch
anteriorly in the right upper lobe showing a maximum SUV of 9.89 with a mean
of 6.6. There is a small area anteriorly in the left lung at the AP window
showing a maximum SUV of 1.9 with a mean of 1.1 with infrahilar right lower
lobe masses showing abnormal localization with the more anterior showing a
maximum SUV of 7.84 and a mean of 4.98 and the more posterior showing a
maximum SUV of 6.91 with a mean of 4.86. The left kidney does excrete
activity which collects in the urinary bladder. The obstruction on the right
kidney appears to be fairly high-grade and does not show excretion of
activity. There is a large area of abnormal localization and soft tissue
density in the presacral region showing a maximum SUV of 10.21 with a mean
of 6.43.
IMPRESSION: 1.  Progressive disease with increasing activity in the presacral region and
new abnormal areas of localization in the right lung. New high-grade
obstructive change with hydronephrosis in the right kidney. Dilated left
ureter and collecting system with excretion of activity by the left kidney
to the bladder. The urinary bladder is abnormal with a diffusely thickened
wall.
2.  There is a focal area of abnormal localization in the right maxillary
alveolar ridge which could be metastatic. Periodontal disease is not
completely excluded.

The findings were called to Dr. Rosalba at the time of dictation.

[REDACTED]

## 2014-07-30 NOTE — H&P (Signed)
Subjective/Chief Complaint Notified post percutaneous nephrostomy tube placement that patient was tachycardic, febrile, and lethargic.   History of Present Illness Patient is being evaluated today in special procedures status post bilateral percutanous nephrostomy tube placements for acute renal failure, hydronephrosis. 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 He has been noncomplaint with appointments for treatment and routine follow ups. Dr. Oliva Bustard was called by specials procedures with tachycardia, hypertension, lethargy, and suspected septic symptoms. Currently percutaneous nephrostomies are draining pink cloudy fluid. Foley is draining cloudy urine with sediment.   Past History Rectal ca   Past Medical Health Cancer   Past Med/Surgical Hx:  colon cancer:   ALLERGIES:  No Known Allergies:   HOME MEDICATIONS: Medication Instructions Status  Stivarga 40 mg oral tablet 3 tab(s) orally once a day Active  Klor-Con 10 oral tablet, extended release 1 tab(s) orally once a day Active  oxyCODONE 10 mg oral tablet 1 tab(s) orally every 6 hours, As Needed - for Pain Active  morphine 30 mg/12 hr oral tablet, extended release 1 tab(s) orally every 12 hours Active  promethazine 25 mg oral tablet 1 tab(s) orally every 6 hours, As Needed- for Nausea, Vomiting  Active   Family and Social History:  Place of Living Home   Review of Systems:  Subjective/Chief Complaint Patient is currently sedated after procedure.   Fever/Chills Yes   Telemetry Reviewed Tachycardia   ROS Pt not able to provide ROS   Physical Exam:  GEN thin, critically ill appearing   HEENT dry oral mucosa, conjunctivae injected   NECK No masses   RESP normal resp effort  clear BS  no use of accessory muscles   CARD Tachycardic   VASCULAR ACCESS PAC   ABD positive liver/spleen enlargement  distended   GU foley catheter in place  cloudy urine, bil;ateral nephrostomy  tubes in place   LYMPH negative neck, negative axillae   SKIN Jaundice   NEURO lethargic   PSYCH sedated   Lab Results:  Routine Coag:  22-May-14 12:55   Prothrombin 13.8  INR 1.0 (INR reference interval applies to patients on anticoagulant therapy. A single INR therapeutic range for coumarins is not optimal for all indications; however, the suggested range for most indications is 2.0 - 3.0. Exceptions to the INR Reference Range may include: Prosthetic heart valves, acute myocardial infarction, prevention of myocardial infarction, and combinations of aspirin and anticoagulant. The need for a higher or lower target INR must be assessed individually. Reference: The Pharmacology and Management of the Vitamin K  antagonists: the seventh ACCP Conference on Antithrombotic and Thrombolytic Therapy. RKYHC.6237 Sept:126 (3suppl): [id number]. A HCT value >55% may artifactually increase the PT.  In one study,  the increase was an average of 25%. Reference:  "Effect on Routine and Special Coagulation Testing Values of Citrate Anticoagulant Adjustment in Patients with High HCT Values." American Journal of Clinical Pathology 2006;126:400-405.)  Activated PTT (APTT)  45.6 (A HCT value >55% may artifactually increase the APTT. In one study, the increase was an average of 19%. Reference: "Effect on Routine and Special Coagulation Testing Values of Citrate Anticoagulant Adjustment in Patients with High HCT Values." American Journal of Clinical Pathology 2006;126:400-405.)   Radiology Results: LabUnknown:    22-May-14 12:29, CT Abdomen and Pelvis Without Contrast  PACS Image    22-May-14 15:45, Tube Placement - Nephrostomy  PACS Image  Vascular:  Tube Placement - Nephrostomy  REASON FOR EXAM:    Hydronephrosis  TUBE  PLACEMENT  COMMENTS:       PROCEDURE: VAS - NEPHROSTOMY TUBE PLACEMENT  - Aug 28 2012  3:45PM     RESULT: History: Hydronephrosis.    Procedure and Findings:After  discussing the risk and benefits of this   procedure with the patient through an interpreter informed consent was   obtained. The back was sterilely prepped and draped and 1% lidocaine   administered. IV conscious sedation performed. 22-gauge needle advanced   into a posterior renal calyx and an 018 wire placed. This was followed by   placement of a 6.5 French guidewire introduction catheter and serial   dilatation to10 [place] with placement of 10 French nephrostomy catheter.   No complications.  IMPRESSION:  Successful right percutaneous nephrostomy. A large amount of   pus was removed from the renal collecting system. Sample sent to   microbiology for further evaluation.        Verified By: Osa Craver, M.D., MD  CT:    22-May-14 12:29, CT Abdomen and Pelvis Without Contrast  CT Abdomen and Pelvis Without Contrast  REASON FOR EXAM:    (1) hydronephrosis; (2) hydronephrosis  COMMENTS:       PROCEDURE: CT  - CT ABDOMEN AND PELVIS W0  - Aug 28 2012 12:29PM     RESULT: History: Hydronephrosis.    Findings: The obtained to determine access site for nephrostomy. Severe   hydronephrosis is noted on the right. Double-J ureteral stent is noted on   the left. Mild hydroureter on the left. Bladder wall thickening and mass   effect in the bladder base noted. Multiple pulmonary masses are present.    IMPRESSION:  Successful prone CT to determine access site for right   percutaneous nephrostomy.    Verified By: Osa Craver, M.D., MD    Assessment/Admission Diagnosis 1. 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  2. Sepsis. 3. Acute renal failure, hydronephrosis.   Plan Order for blood and urine cultures entered. Will proceed with anitbiotics via IV as well as hydration. Foley in place and bilateral nephrostomy tubes were draining pink puss like liquid. Discussed with sister overall very poor prognosis due to progressive disease. Will admit to hospital for continued inpatient treatment.  Telemetry monitor will be ordered to monitor closely.  Dr. Ermalinda Memos has also discussed with family. Will monitor for the next couple of days to determine if his status will improve.  Will continue discussions regarding code status.   Electronic Signatures for Addendum Section:  Jobe Gibbon (MD) (Signed Addendum [id number] 17:23)  Attending"s note: I personally examined patient.  Face-to-face encounter for more than 45 minutes in discussion with the family regarding overall poor prognosis. Formulated   plan  regarding treatment  with NP, DISCUSSED  situation with Dr. Vallery Ridge.  Regarding no code.  Also discussed situation with Dr. Kallie Edward. Overall poor prognosis and possibility of hospice care the patient had stabilized has been discussed.   Electronic Signatures: Georgeanne Nim (NP)  (Signed 22-May-14 16:44)  Authored: CHIEF COMPLAINT and HISTORY, PAST MEDICAL/SURGIAL HISTORY, ALLERGIES, HOME MEDICATIONS, FAMILY AND SOCIAL HISTORY, REVIEW OF SYSTEMS, PHYSICAL EXAM, LABS, Radiology, ASSESSMENT AND PLAN   Last Updated: 22-May-14 17:23 by Jobe Gibbon (MD)

## 2014-07-30 NOTE — Discharge Summary (Signed)
Dates of Admission and Diagnosis:  Date of Admission 29-Aug-2012   Date of Discharge 03-Sep-2012   Admitting Diagnosis Hypotension   Final Diagnosis gram  negative sepsis   withSirs due to urinary tract infection   Discharge Diagnosis 1 Bilateral hydronephroses with status post nephrostomy tube placement.   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 Subjective/Chief Complaint Notified post percutaneous nephrostomy tube placement that patient was tachycardic, febrile, and lethargic. ?? History of Present Illness Patient is being evaluated today in special procedures status post bilateral percutanous nephrostomy tube placements for acute renal failure, hydronephrosis. 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 He has been noncomplaint with appointments for treatment and routine follow ups. Dr. Oliva Bustard was called by specials procedures with tachycardia, hypertension, lethargy, and suspected septic symptoms. Currently percutaneous nephrostomies are draining pink cloudy fluid. Foley is draining cloudy urine with sediment.     Hepatic:  23-May-14 06:16   Bilirubin, Total 0.5  Alkaline Phosphatase 119  SGPT (ALT)  9  SGOT (AST)  13  Total Protein, Serum  6.3  Albumin, Serum  1.5  24-May-14 06:33   Bilirubin, Total 0.2  Alkaline Phosphatase 112  SGPT (ALT)  9  SGOT (AST)  14  Total Protein, Serum 6.4  Albumin, Serum  1.5  27-May-14 05:41   Bilirubin, Total 0.2  Alkaline Phosphatase 101  SGPT (ALT)  10  SGOT (AST) 18  Total Protein, Serum 6.7  Albumin, Serum  1.4  Routine Chem:  23-May-14 06:16   Glucose, Serum  128  BUN   48  Creatinine (comp)  3.18  Sodium, Serum 140  Potassium, Serum  3.3  Chloride, Serum  115  CO2, Serum  13  Calcium (Total), Serum  7.7  Osmolality (calc) 294  eGFR (African American)  25  eGFR (Non-African American)  22 (eGFR values <10m/min/1.73 m2 may be an indication of chronic kidney disease (CKD). Calculated eGFR is useful in patients with stable renal function. The eGFR calculation will not be reliable in acutely ill patients when serum creatinine is changing rapidly. It is not useful in  patients on dialysis. The eGFR calculation may not be applicable to patients at the low and high extremes of body sizes, pregnant women, and vegetarians.)  Anion Gap 12  Magnesium, Serum  1.5 (1.8-2.4 THERAPEUTIC RANGE: 4-7 mg/dL TOXIC: > 10 mg/dL  -----------------------)  24-May-14 06:33   Glucose, Serum  130  BUN  45  Creatinine (comp)  2.98  Sodium, Serum 141  Potassium, Serum  3.2  Chloride, Serum  116  CO2, Serum  14  Calcium (Total), Serum  8.0  Osmolality (calc) 295  eGFR (African American)  27  eGFR (Non-African American)  23 (eGFR values <68mmin/1.73 m2 may be an indication of chronic kidney disease (CKD). Calculated eGFR is useful in patients with stable renal function. The eGFR calculation will not be reliable in acutely ill patients when serum creatinine is changing rapidly. It is not useful in  patients on dialysis. The eGFR calculation may not be applicable to patients at the low and high extremes of body sizes, pregnant women, and vegetarians.)  Anion Gap 11  25-May-14 04:55  Glucose, Serum 92  BUN  33  Creatinine (comp)  2.52  Sodium, Serum 141  Potassium, Serum  3.3  Chloride, Serum  116  CO2, Serum  16  Calcium (Total), Serum  7.8  Osmolality (calc) 288  eGFR (African American)  33  eGFR (Non-African American)  29 (eGFR values <75m/min/1.73 m2 may be an indication of chronic kidney disease (CKD). Calculated eGFR is useful in patients with stable  renal function. The eGFR calculation will not be reliable in acutely ill patients when serum creatinine is changing rapidly. It is not useful in  patients on dialysis. The eGFR calculation may not be applicable to patients at the low and high extremes of body sizes, pregnant women, and vegetarians.)  Anion Gap 9  27-May-14 05:41   Result Comment labs - This specimen was collected through an   - indwelling catheter or arterial line.  - A minimum of 58m of blood was wasted prior    - to collecting the sample.  Interpret  - results with caution.  Result(s) reported on 02 Sep 2012 at 08:11AM.  Glucose, Serum 89  BUN  20  Creatinine (comp)  2.21  Sodium, Serum 145  Potassium, Serum 3.8  Chloride, Serum  115  CO2, Serum  19  Calcium (Total), Serum  7.9  Osmolality (calc) 291  eGFR (African American)  39  eGFR (Non-African American)  33 (eGFR values <6066min/1.73 m2 may be an indication of chronic kidney disease (CKD). Calculated eGFR is useful in patients with stable renal function. The eGFR calculation will not be reliable in acutely ill patients when serum creatinine is changing rapidly. It is not useful in  patients on dialysis. The eGFR calculation may not be applicable to patients at the low and high extremes of body sizes, pregnant women, and vegetarians.)  Anion Gap 11  Routine Hem:  23-May-14 06:16   WBC (CBC)  34.2  RBC (CBC)  3.78  Hemoglobin (CBC)  8.4  Hematocrit (CBC)  27.4  Platelet Count (CBC)  540  MCV  72  MCH  22.2  MCHC  30.7  RDW  22.1  Neutrophil % 94.0  Lymphocyte % 2.8  Monocyte % 3.0  Eosinophil % 0.0  Basophil % 0.2  Neutrophil #  32.1  Lymphocyte # 1.0  Monocyte # 1.0  Eosinophil # 0.0  Basophil # 0.1 (Result(s) reported on 29 Aug 2012 at 06:51AM.)  24-May-14 06:33   WBC (CBC)  22.6  RBC (CBC)  3.75  Hemoglobin (CBC)  8.5  Hematocrit (CBC)  26.7  Platelet Count (CBC)  539  MCV  71  MCH  22.7  MCHC  31.8  RDW  21.7  Neutrophil %  85.2  Lymphocyte % 6.2  Monocyte % 5.8  Eosinophil % 2.2  Basophil % 0.6  Neutrophil #  19.3  Lymphocyte # 1.4  Monocyte #  1.3  Eosinophil # 0.5  Basophil # 0.1 (Result(s) reported on 30 Aug 2012 at 06:47AM.)  27-May-14 05:41   WBC (CBC) 6.2  RBC (CBC)  3.45  Hemoglobin (CBC)  8.5  Hematocrit (CBC)  25.2  Platelet Count (CBC)  520  MCV  73  MCH  24.6  MCHC 33.7  RDW  23.1  Neutrophil % 75.7  Lymphocyte % 8.8  Monocyte % 8.3  Eosinophil % 6.2  Basophil % 1.0  Neutrophil # 4.7  Lymphocyte #  0.5  Monocyte # 0.5  Eosinophil # 0.4  Basophil # 0.1   Hospital Course:  Hospital Course During hospital stay patient remained hypotensive for 24 hours.  Received intravenous fluid.  Hemoglobin was 5.5 g received blood transfusion.  Was started on IV antibiotics IV I. therapy Palliative care consult was obtained.  This is fairly agreed about the resuscitation Pain was better controlled on the fentanyl patch and Dilaudid Patient also had oral candidiasis was started on Diflucan Overall prognosis was poor.  Communication was poor because of language barrier Hospice was involved in the care of the patient and patient will go home with hospice care.  Likely that patient would be transferred to hospice home at some point in time as patient's condition declines.   Condition on Discharge Poor   DISCHARGE INSTRUCTIONS HOME MEDS:  Medication Reconciliation: Patient's Home Medications at Discharge:     Medication Instructions  diflucan 100 mg oral tablet  1 tab(s) orally once a day   keflex 500 mg oral capsule  1 cap(s) orally 4 times a day   dilaudid 2 mg oral tablet  1 tab(s) orally every 4 hours, As Needed   duragesic-50 50 mcg/hr transdermal film, extended release  1 patch transdermal every 72 hours, As Needed     Physician's Instructions:  Home Health? No   Treatments Nephrostomy care   Home Oxygen? No   Diet Regular   Dietary Supplements Ensure   Activity Limitations  None   Referrals Hospice   Return to Work Not Applicable   Time frame for Follow Up Appointment As needed   Other Comments More than 30 minutes was spent in arranging for discharge.  Discussion with patient's family, hospice nurse, and nurse on the floor, And palliative care physicians Dr. Vallery Ridge   Electronic Signatures: Jobe Gibbon (MD)  (Signed 28-May-14 13:17)  Authored: ADMISSION DATE AND DIAGNOSIS, CHIEF COMPLAINT/HPI, PERTINENT [place], PATIENT INSTRUCTIONS   Last Updated: 28-May-14 13:17 by Jobe Gibbon (MD)
